# Patient Record
Sex: Male | Born: 1975 | Hispanic: No | Marital: Married | Smoking: Current every day smoker
Health system: Southern US, Community
[De-identification: ages and names within clinical notes are randomized; demographics above are authoritative.]

---

## 2018-06-29 ENCOUNTER — Encounter (HOSPITAL_COMMUNITY): Payer: Self-pay | Admitting: Emergency Medicine

## 2018-06-29 ENCOUNTER — Ambulatory Visit (HOSPITAL_COMMUNITY)
Admission: EM | Admit: 2018-06-29 | Discharge: 2018-06-29 | Disposition: A | Discharge status: Home / Self Care | Payer: BLUE CROSS/BLUE SHIELD | Attending: Family Medicine | Admitting: Family Medicine

## 2018-06-29 ENCOUNTER — Other Ambulatory Visit: Payer: Self-pay

## 2018-06-29 DIAGNOSIS — R1013 Epigastric pain: Secondary | ICD-10-CM | POA: Diagnosis not present

## 2018-06-29 MED ORDER — LIDOCAINE VISCOUS HCL 2 % MT SOLN
OROMUCOSAL | Status: AC
  Filled 2018-06-29: qty 15

## 2018-06-29 MED ORDER — ALUM & MAG HYDROXIDE-SIMETH 200-200-20 MG/5ML PO SUSP
30.0000 mL | Freq: Once | ORAL | Status: AC
  Administered 2018-06-29: 30 mL via ORAL

## 2018-06-29 MED ORDER — DICYCLOMINE HCL 20 MG PO TABS: 20.0000 mg | ORAL_TABLET | Freq: Three times a day (TID) | ORAL | 0 refills | Status: AC

## 2018-06-29 MED ORDER — OMEPRAZOLE 20 MG PO TBDD: 20.0000 mg | DELAYED_RELEASE_TABLET | Freq: Every day | ORAL | 0 refills | Status: AC

## 2018-06-29 MED ORDER — ALUM & MAG HYDROXIDE-SIMETH 200-200-20 MG/5ML PO SUSP
ORAL | Status: AC
  Filled 2018-06-29: qty 30

## 2018-06-29 MED ORDER — LIDOCAINE VISCOUS HCL 2 % MT SOLN
15.0000 mL | Freq: Once | OROMUCOSAL | Status: AC
  Administered 2018-06-29: 09:00:00 15 mL via ORAL

## 2018-06-29 NOTE — Discharge Instructions (Signed)
Please begin omeprazole daily for the next 2 weeks May try bentyl before meals and bedtime Please go to emergency room if pain worsening

## 2018-06-29 NOTE — ED Triage Notes (Signed)
Abdominal pain that started 2-3 days ago.  Reports pain is severe.  No vomiting, no diarrhea.  Points to center epigastric area as location of pain

## 2018-06-29 NOTE — ED Provider Notes (Signed)
[facility]    CSN: [id number] Arrival date & time: 06/29/18  4098     History   Chief Complaint Chief Complaint  Patient presents with  . Abdominal Pain    HPI [place] interpretation via [employer] interpreter-interpretation still seemed slightly difficult based off his dialect Bruce Barnes is a 43 y.o. male history of tobacco use presenting today for evaluation of abdominal pain.  Patient states that he has had discomfort in his central abdomen for the past 2 to 3 days.  Pain is described as dull.  He has no associated nausea or vomiting.  Denies associated diarrhea or constipation.  Denies any changes in bowels.  States that the pain has been constant.  Cannot state any specific triggers or any relieving factors.  Denies changes with eating, movement or position.  Denies chest pain or shortness of breath.  Denies fevers.  Denies recent URI symptoms denying cough, congestion or sore throat.  Denies previous history of similar symptoms with his abdomen.  He has not taken anything over-the-counter for his symptoms.  Denies history of hypertension and diabetes.  HPI  History reviewed. No pertinent past medical history.  There are no active problems to display for this patient.   History reviewed. No pertinent surgical history.     Home Medications    Prior to Admission medications   Medication Sig Start Date End Date Taking? Authorizing Provider  dicyclomine (BENTYL) 20 MG tablet Take 1 tablet (20 mg total) by mouth 4 (four) times daily -  before meals and at bedtime. 06/29/18   ,  C, PA-C  Omeprazole 20 MG TBDD Take 20 mg by mouth daily. 06/29/18   , Junius Creamer, PA-C    Family History History reviewed. No pertinent family history.  Social History Social History   Tobacco Use  . Smoking status: Current Every Day Smoker  Substance Use Topics  . Alcohol use: Never    Frequency: Never  . Drug use: Never     Allergies   Patient has no known  allergies.   Review of Systems Review of Systems  Constitutional: Negative for activity change, appetite change, chills, fatigue and fever.  HENT: Negative for congestion, ear pain, rhinorrhea, sinus pressure, sore throat and trouble swallowing.   Eyes: Negative for discharge and redness.  Respiratory: Negative for cough, chest tightness and shortness of breath.   Cardiovascular: Negative for chest pain.  Gastrointestinal: Positive for abdominal pain. Negative for diarrhea, nausea and vomiting.  Musculoskeletal: Negative for myalgias.  Skin: Negative for rash.  Neurological: Negative for dizziness, light-headedness and headaches.     Physical Exam Triage Vital Signs ED Triage Vitals  Enc Vitals Group     BP 06/29/18 0827 (!) 158/96     Pulse Rate 06/29/18 0827 72     Resp 06/29/18 0827 18     Temp 06/29/18 0827 (!) 97.3 F (36.3 C)     Temp Source 06/29/18 0827 Oral     SpO2 06/29/18 0827 100 %     Weight --      Height --      Head Circumference --      Peak Flow --      Pain Score 06/29/18 0830 8     Pain Loc --      Pain Edu? --      Excl. in GC? --    No data found.  Updated Vital Signs BP (!) 158/96 (BP Location: Right Arm)   Pulse 72  Temp (!) 97.3 F (36.3 C) (Oral)   Resp 18   SpO2 100%   Visual Acuity Right Eye Distance:   Left Eye Distance:   Bilateral Distance:    Right Eye Near:   Left Eye Near:    Bilateral Near:     Physical Exam Vitals signs and nursing note reviewed.  Constitutional:      Appearance: He is well-developed.  HENT:     Head: Normocephalic and atraumatic.     Mouth/Throat:     Comments: Oral mucosa pink and moist, no tonsillar enlargement or exudate. Posterior pharynx patent and nonerythematous, no uvula deviation or swelling. Normal phonation. Eyes:     Conjunctiva/sclera: Conjunctivae normal.  Neck:     Musculoskeletal: Neck supple.  Cardiovascular:     Rate and Rhythm: Normal rate and regular rhythm.     Heart  sounds: No murmur.  Pulmonary:     Effort: Pulmonary effort is normal. No respiratory distress.     Breath sounds: Normal breath sounds.     Comments: Breathing comfortably at rest, CTABL, no wheezing, rales or other adventitious sounds auscultated Abdominal:     Palpations: Abdomen is soft.     Tenderness: There is abdominal tenderness.     Comments: Soft, nondistended, tender to epigastrium, nontender in right and left upper quadrants as well as bilateral lower quadrants.  Negative rebound, negative Rovsing.  No pulsatile mass.  Skin:    General: Skin is warm and dry.  Neurological:     Mental Status: He is alert.      UC Treatments / Results  Labs (all labs ordered are listed, but only abnormal results are displayed) Labs Reviewed - No data to display  EKG None  Radiology No results found.  Procedures Procedures (including critical care time)  Medications Ordered in UC Medications  alum & mag hydroxide-simeth (MAALOX/MYLANTA) 200-200-20 MG/5ML suspension 30 mL (30 mLs Oral Given 06/29/18 0908)    And  lidocaine (XYLOCAINE) 2 % viscous mouth solution 15 mL (15 mLs Oral Given 06/29/18 0908)    Initial Impression / Assessment and Plan / UC Course  I have reviewed the triage vital signs and the nursing notes.  Pertinent labs & imaging results that were available during my care of the patient were reviewed by me and considered in my medical decision making (see chart for details).     EKG normal sinus rhythm, no acute signs of ischemia or infarction. GI cocktail provided had mild improvement in symptoms after approximately 15 minutes. Will treat as gastritis/GERD with omeprazole 20 mg daily, also provided Bentyl to use as needed to see if this can further help with discomfort given translation difficult.  Advised to follow-up in emergency room if pain worsening.  At this time abdominal exam negative for peritoneal signs, vital signs stable. Discussed strict return  precautions. Patient verbalized understanding and is agreeable with plan.  Final Clinical Impressions(s) / UC Diagnoses   Final diagnoses:  Epigastric pain     Discharge Instructions     Please begin omeprazole daily for the next 2 weeks May try bentyl before meals and bedtime Please go to emergency room if pain worsening    ED Prescriptions    Medication Sig Dispense Auth. Provider   Omeprazole 20 MG TBDD Take 20 mg by mouth daily. 30 tablet ,  C, PA-C   dicyclomine (BENTYL) 20 MG tablet Take 1 tablet (20 mg total) by mouth 4 (four) times daily -  before meals and  at bedtime. 20 tablet , [place] C, PA-C     Controlled Substance Prescriptions [place] Controlled Substance Registry consulted? Not Applicable   Lew Dawes,  C, [place] 06/29/18 [id number]

## 2020-12-31 ENCOUNTER — Encounter (HOSPITAL_COMMUNITY): Payer: Self-pay | Admitting: Emergency Medicine

## 2020-12-31 ENCOUNTER — Ambulatory Visit (INDEPENDENT_AMBULATORY_CARE_PROVIDER_SITE_OTHER): Payer: BC Managed Care – PPO

## 2020-12-31 ENCOUNTER — Other Ambulatory Visit: Payer: Self-pay

## 2020-12-31 ENCOUNTER — Ambulatory Visit (HOSPITAL_COMMUNITY)
Admission: EM | Admit: 2020-12-31 | Discharge: 2020-12-31 | Disposition: A | Discharge status: Home / Self Care | Payer: BC Managed Care – PPO | Attending: Emergency Medicine | Admitting: Emergency Medicine

## 2020-12-31 DIAGNOSIS — M545 Low back pain, unspecified: Secondary | ICD-10-CM

## 2020-12-31 DIAGNOSIS — G8929 Other chronic pain: Secondary | ICD-10-CM

## 2020-12-31 LAB — POCT URINALYSIS DIPSTICK, ED / UC
Bilirubin Urine: NEGATIVE
Glucose, UA: NEGATIVE mg/dL
Hgb urine dipstick: NEGATIVE
Ketones, ur: NEGATIVE mg/dL
Leukocytes,Ua: NEGATIVE
Nitrite: NEGATIVE
Protein, ur: NEGATIVE mg/dL
Specific Gravity, Urine: 1.015 (ref 1.005–1.030)
Urobilinogen, UA: 0.2 mg/dL (ref 0.0–1.0)
pH: 8.5 — ABNORMAL HIGH (ref 5.0–8.0)

## 2020-12-31 MED ORDER — METHYLPREDNISOLONE SODIUM SUCC 125 MG IJ SOLR
125.0000 mg | Freq: Once | INTRAMUSCULAR | Status: AC
  Administered 2020-12-31: 125 mg via INTRAMUSCULAR

## 2020-12-31 MED ORDER — KETOROLAC TROMETHAMINE 30 MG/ML IJ SOLN
INTRAMUSCULAR | Status: AC
  Filled 2020-12-31: qty 1

## 2020-12-31 MED ORDER — METHYLPREDNISOLONE SODIUM SUCC 125 MG IJ SOLR
INTRAMUSCULAR | Status: AC
  Filled 2020-12-31: qty 2

## 2020-12-31 MED ORDER — KETOROLAC TROMETHAMINE 30 MG/ML IJ SOLN
30.0000 mg | Freq: Once | INTRAMUSCULAR | Status: AC
  Administered 2020-12-31: 30 mg via INTRAMUSCULAR

## 2020-12-31 MED ORDER — MELOXICAM 7.5 MG PO TABS: 7.5000 mg | ORAL_TABLET | Freq: Every day | ORAL | 0 refills | Status: AC

## 2020-12-31 NOTE — ED Provider Notes (Signed)
[facility]    CSN: [id number] Arrival date & time: 12/31/20  0938      History   Chief Complaint Chief Complaint  Patient presents with   Back Pain    HPI Bruce Barnes is a 45 y.o. male.   Pt is here for rt sided back pain. Inrep used with pt and family at bedside. Pt states that he has chronic pain in the lower back area. Has no pcp to have this evaluated. Denies any urinary sx, no loss of urine. Denies any injury. Pt denies any radiation of pain. No chest pain, no sob. Has not taken anything pta.  [place]  History reviewed. No pertinent past medical history.  There are no problems to display for this patient.   History reviewed. No pertinent surgical history.     Home Medications    Prior to Admission medications   Medication Sig Start Date End Date Taking? Authorizing Provider  meloxicam (MOBIC) 7.5 MG tablet Take 1 tablet (7.5 mg total) by mouth daily. 12/31/20  Yes Coralyn Mark, NP  dicyclomine (BENTYL) 20 MG tablet Take 1 tablet (20 mg total) by mouth 4 (four) times daily -  before meals and at bedtime. Patient not taking: Reported on 12/31/2020 06/29/18   Wieters, Hallie C, PA-C  Omeprazole 20 MG TBDD Take 20 mg by mouth daily. Patient not taking: Reported on 12/31/2020 06/29/18   Lew Dawes, PA-C    Family History History reviewed. No pertinent family history.  Social History Social History   Tobacco Use   Smoking status: Every Day    Types: Cigarettes   Smokeless tobacco: Never  Vaping Use   Vaping Use: Never used  Substance Use Topics   Alcohol use: Never   Drug use: Never     Allergies   Patient has no known allergies.   Review of Systems Review of Systems   Physical Exam Triage Vital Signs ED Triage Vitals  Enc Vitals Group     BP 12/31/20 0908 132/79     Pulse Rate 12/31/20 0908 71     Resp 12/31/20 0908 18     Temp 12/31/20 0908 98.1 F (36.7 C)     Temp Source 12/31/20 0908 Oral     SpO2 12/31/20 0908 98 %      Weight --      Height --      Head Circumference --      Peak Flow --      Pain Score 12/31/20 0904 10     Pain Loc --      Pain Edu? --      Excl. in GC? --    No data found.  Updated Vital Signs BP 132/79 (BP Location: Left Arm)   Pulse 71   Temp 98.1 F (36.7 C) (Oral)   Resp 18   SpO2 98%   Visual Acuity Right Eye Distance:   Left Eye Distance:   Bilateral Distance:    Right Eye Near:   Left Eye Near:    Bilateral Near:     Physical Exam   UC Treatments / Results  Labs (all labs ordered are listed, but only abnormal results are displayed) Labs Reviewed  POCT URINALYSIS DIPSTICK, ED / UC - Abnormal; Notable for the following components:      Result Value   pH 8.5 (*)    All other components within normal limits    EKG   Radiology DG Lumbar Spine Complete  Result  Date: 12/31/2020 CLINICAL DATA:  Right lower back pain starting yesterday EXAM: LUMBAR SPINE - COMPLETE 4+ VIEW COMPARISON:  None. FINDINGS: There R 5 non-rib-bearing lumbar type vertebral bodies. Vertebral body heights are preserved. Alignment is normal. There is no spondylolysis. The disc spaces are preserved. There is mild degenerative endplate change throughout the lumbar spine, most notable at L4. The SI joints and symphysis pubis are intact. IMPRESSION: 1. No acute findings in the lumbar spine. 2. Mild degenerative endplate change at L3 and L4. Electronically Signed   By: Lesia Hausen M.D.   On: 12/31/2020 09:52    Procedures Procedures (including critical care time)  Medications Ordered in UC Medications  ketorolac (TORADOL) 30 MG/ML injection 30 mg (30 mg Intramuscular Given 12/31/20 0940)  methylPREDNISolone sodium succinate (SOLU-MEDROL) 125 mg/2 mL injection 125 mg (125 mg Intramuscular Given 12/31/20 0941)    Initial Impression / Assessment and Plan / UC Course  I have reviewed the triage vital signs and the nursing notes.  Pertinent labs & imaging results that were available  during my care of the patient were reviewed by me and considered in my medical decision making (see chart for details).     You will need to see orthopedic for your symptoms  Take medications as needed for pain Can use heat to help with pain  Go to er if symptoms become worse  Final Clinical Impressions(s) / UC Diagnoses   Final diagnoses:  Chronic right-sided low back pain without sciatica     Discharge Instructions      You will need to see orthopedic for your symptoms  Take medications as needed for pain Can use heat to help with pain  Go to er if symptoms become worse      ED Prescriptions     Medication Sig Dispense Auth. Provider   meloxicam (MOBIC) 7.5 MG tablet Take 1 tablet (7.5 mg total) by mouth daily. 30 tablet Coralyn Mark, NP      PDMP not reviewed this encounter.   Coralyn Mark, NP 12/31/20 1007

## 2020-12-31 NOTE — Discharge Instructions (Addendum)
You will need to see orthopedic for your symptoms  Take medications as needed for pain Can use heat to help with pain  Go to er if symptoms become worse

## 2020-12-31 NOTE — ED Triage Notes (Signed)
Right lower back pain, started yesterday.  Non-radiating back pain, and no known injury.  Denies urinary symptoms.  Patient has a history of back pain

## 2020-12-31 NOTE — ED Notes (Signed)
Returned to room from bathroom

## 2023-06-06 IMAGING — DX DG LUMBAR SPINE COMPLETE 4+V
4 series · 4 of 4 positions shown · non-contrast
Comparison: None.

CLINICAL DATA: Right lower back pain starting yesterday

EXAM:
LUMBAR SPINE - COMPLETE 4+ VIEW

[l-spine ap]
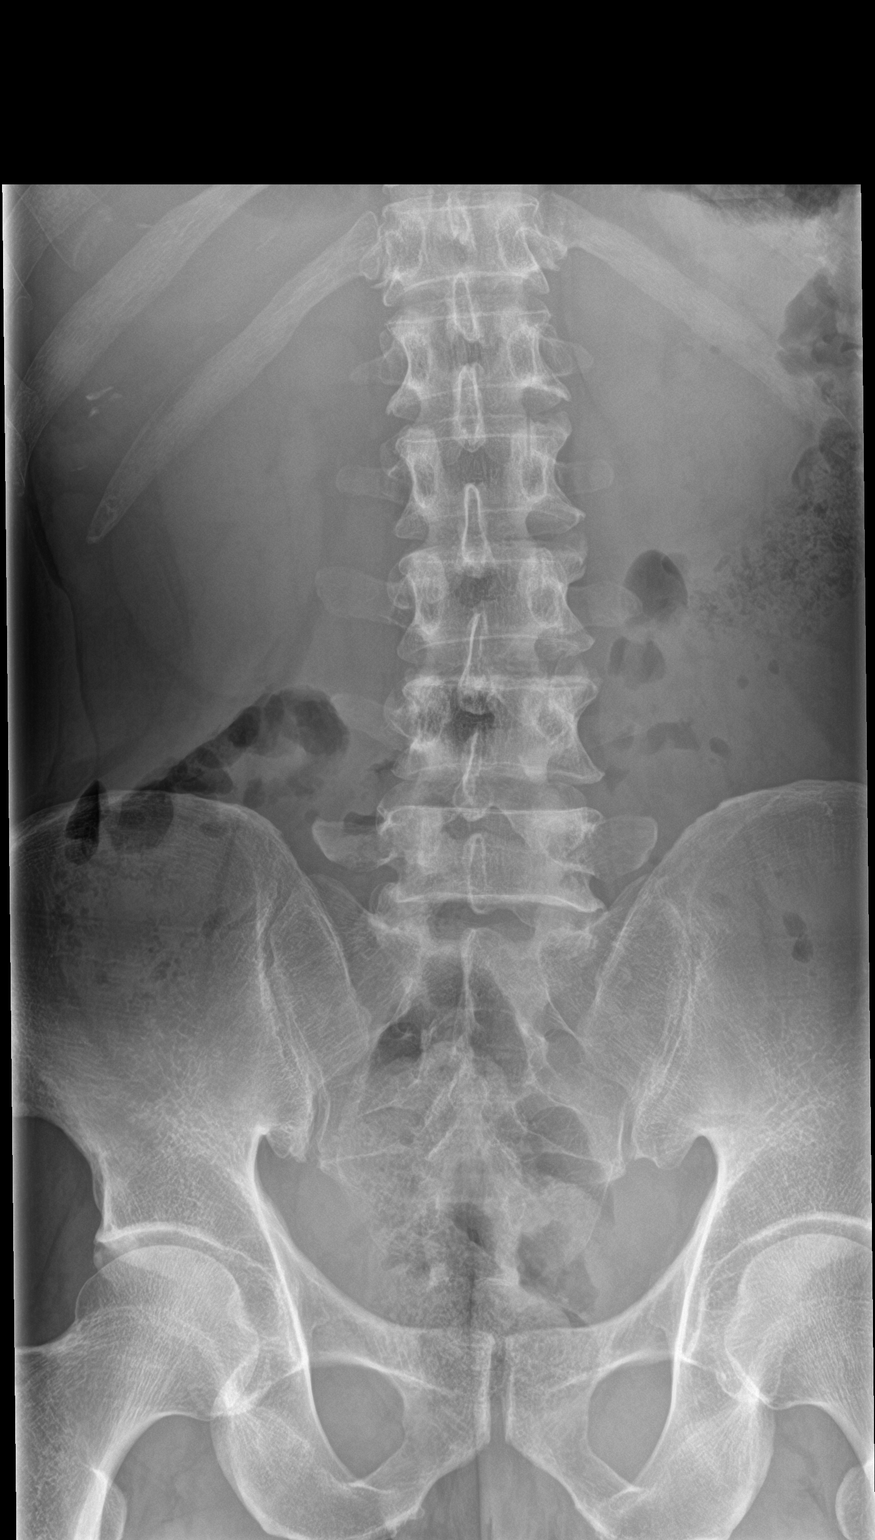

[l-spine obl (1 of 2)]
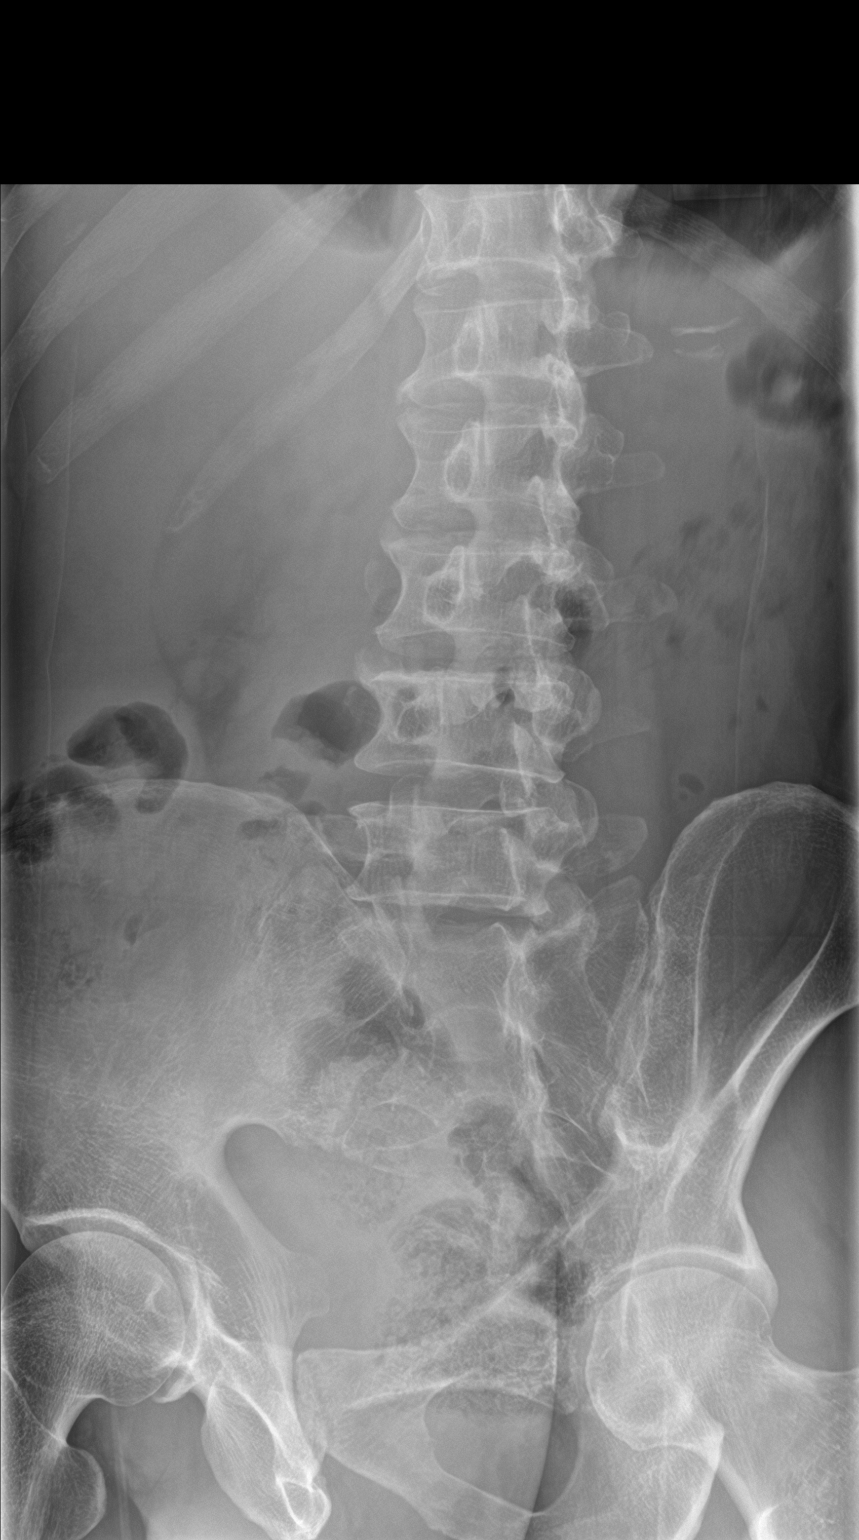

[l-spine obl (2 of 2)]
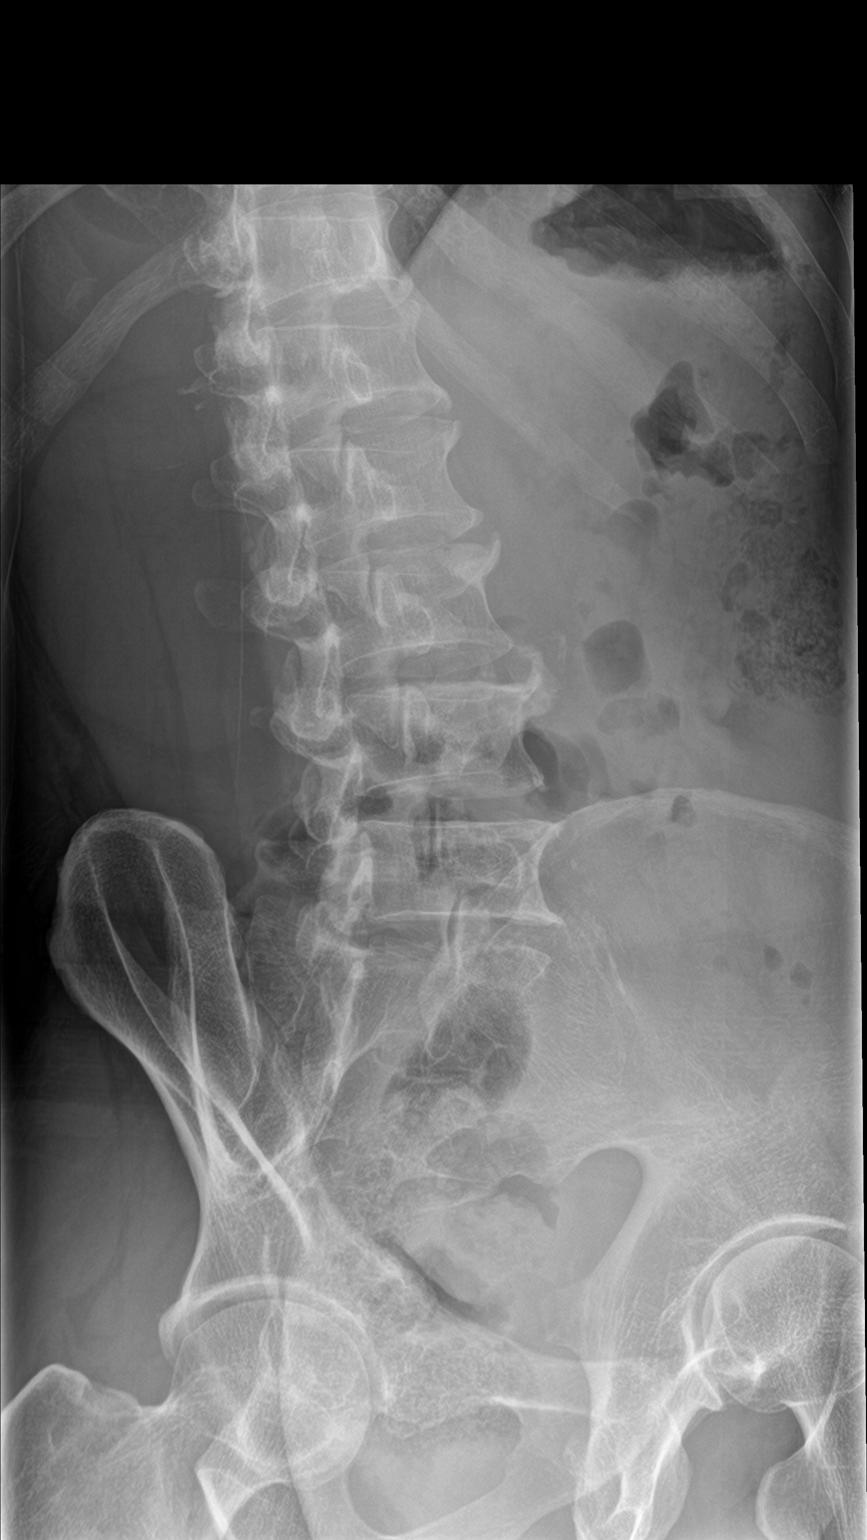

[l-spine lat]
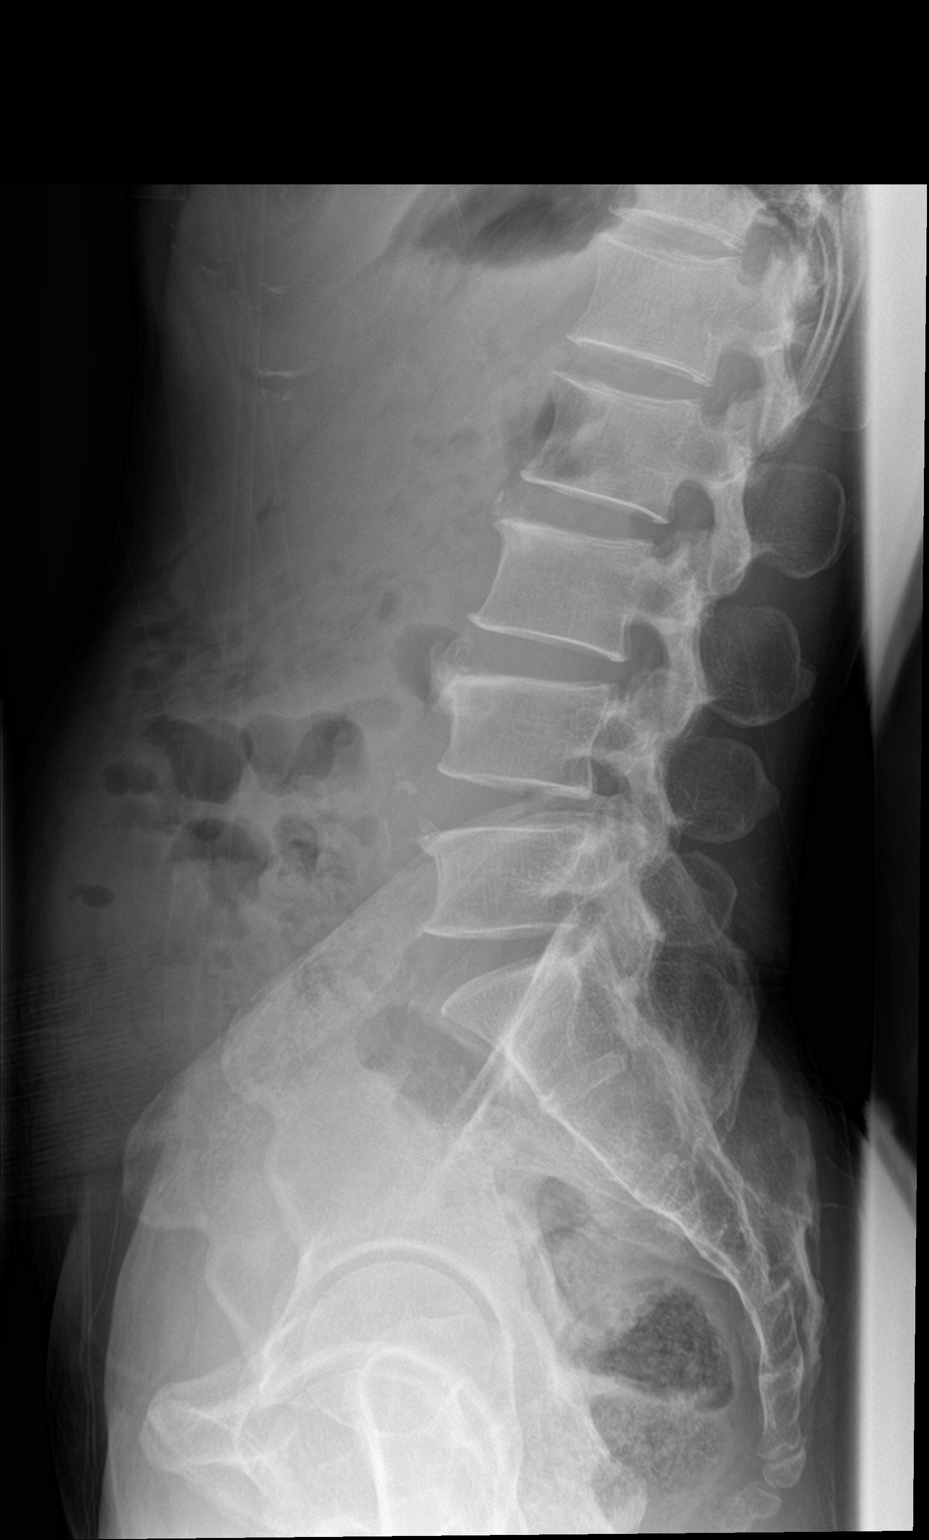

[4 of 4 positions shown; findings below may reference images not displayed]

FINDINGS: There R 5 non-rib-bearing lumbar type vertebral bodies. Vertebral
body heights are preserved. Alignment is normal. There is no
spondylolysis.

The disc spaces are preserved. There is mild degenerative endplate
change throughout the lumbar spine, most notable at L4.

The SI joints and symphysis pubis are intact.
IMPRESSION: 1. No acute findings in the lumbar spine.
2. Mild degenerative endplate change at L3 and L4.
